# Patient Record
Sex: Female | Born: 1990 | Race: Black or African American | Hispanic: No | Marital: Single | Smoking: Current every day smoker
Health system: Southern US, Community
[De-identification: ages and names within clinical notes are randomized; demographics above are authoritative.]

## PROBLEM LIST (undated history)

## (undated) DIAGNOSIS — F419 Anxiety disorder, unspecified: Secondary | ICD-10-CM

## (undated) DIAGNOSIS — G8929 Other chronic pain: Secondary | ICD-10-CM

---
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
## 2008-04-06 ENCOUNTER — Emergency Department (HOSPITAL_COMMUNITY): Admission: EM | Admit: 2008-04-06 | Discharge: 2008-04-06 | Payer: Self-pay | Admitting: Family Medicine

## 2008-11-01 ENCOUNTER — Encounter
Admission: RE | Admit: 2008-11-01 | Discharge: 2008-11-01 | Payer: Self-pay | Admitting: Physical Medicine and Rehabilitation
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
## 2011-04-01 LAB — COMPREHENSIVE METABOLIC PANEL
AST: 19
Alkaline Phosphatase: 78
BUN: 11
CO2: 27
Chloride: 105
GFR calc Af Amer: >60
Sodium: 140
Total Bilirubin: 0.7
Total Protein: 6.9

## 2011-04-01 LAB — I-STAT 8, (EC8 V) (CONVERTED LAB)
HCT: 39
Hemoglobin: 13.3
Operator id: 270111
Sodium: 141

## 2011-04-01 LAB — DIFFERENTIAL
Lymphocytes Relative: 41
Lymphs Abs: 2.9
Neutrophils Relative %: 50

## 2011-04-01 LAB — POCT I-STAT CREATININE: Operator id: 270111

## 2011-04-01 LAB — CBC
MCHC: 33.2
Platelets: 344
RBC: 4.38

## 2011-04-04 LAB — URINALYSIS, ROUTINE W REFLEX MICROSCOPIC
Glucose, UA: NEGATIVE
Hgb urine dipstick: NEGATIVE
Ketones, ur: 40 — AB
Leukocytes, UA: NEGATIVE
Specific Gravity, Urine: 1.03

## 2011-04-04 LAB — PREGNANCY, URINE: Preg Test, Ur: NEGATIVE

## 2015-08-27 ENCOUNTER — Emergency Department (HOSPITAL_COMMUNITY): Payer: Medicaid Other

## 2015-08-27 ENCOUNTER — Emergency Department (HOSPITAL_COMMUNITY)
Admission: EM | Admit: 2015-08-27 | Discharge: 2015-08-27 | Disposition: A | Discharge status: Home / Self Care | Payer: Medicaid Other | Attending: Emergency Medicine | Admitting: Emergency Medicine

## 2015-08-27 ENCOUNTER — Encounter (HOSPITAL_COMMUNITY): Payer: Self-pay | Admitting: *Deleted

## 2015-08-27 DIAGNOSIS — Y998 Other external cause status: Secondary | ICD-10-CM | POA: Diagnosis not present

## 2015-08-27 DIAGNOSIS — G8929 Other chronic pain: Secondary | ICD-10-CM | POA: Diagnosis not present

## 2015-08-27 DIAGNOSIS — Z880 Allergy status to penicillin: Secondary | ICD-10-CM | POA: Diagnosis not present

## 2015-08-27 DIAGNOSIS — F17210 Nicotine dependence, cigarettes, uncomplicated: Secondary | ICD-10-CM | POA: Diagnosis not present

## 2015-08-27 DIAGNOSIS — Y9389 Activity, other specified: Secondary | ICD-10-CM | POA: Insufficient documentation

## 2015-08-27 DIAGNOSIS — S161XXA Strain of muscle, fascia and tendon at neck level, initial encounter: Secondary | ICD-10-CM | POA: Diagnosis not present

## 2015-08-27 DIAGNOSIS — S39012A Strain of muscle, fascia and tendon of lower back, initial encounter: Secondary | ICD-10-CM | POA: Diagnosis not present

## 2015-08-27 DIAGNOSIS — Z8659 Personal history of other mental and behavioral disorders: Secondary | ICD-10-CM | POA: Diagnosis not present

## 2015-08-27 DIAGNOSIS — Y92410 Unspecified street and highway as the place of occurrence of the external cause: Secondary | ICD-10-CM | POA: Insufficient documentation

## 2015-08-27 DIAGNOSIS — S199XXA Unspecified injury of neck, initial encounter: Secondary | ICD-10-CM | POA: Diagnosis present

## 2015-08-27 HISTORY — DX: Anxiety disorder, unspecified: F41.9

## 2015-08-27 HISTORY — DX: Other chronic pain: G89.29

## 2015-08-27 LAB — POC URINE PREG, ED: Preg Test, Ur: NEGATIVE

## 2015-08-27 MED ORDER — IBUPROFEN 800 MG PO TABS: 800.0000 mg | ORAL_TABLET | Freq: Three times a day (TID) | ORAL | Status: AC | PRN

## 2015-08-27 MED ORDER — TRAMADOL HCL 50 MG PO TABS: 50.0000 mg | ORAL_TABLET | Freq: Four times a day (QID) | ORAL | Status: AC | PRN

## 2015-08-27 NOTE — ED Notes (Signed)
Declined W/C at D/C and was escorted to lobby by RN. 

## 2015-08-27 NOTE — Discharge Instructions (Signed)
Return here as needed.  Your x-rays did not show any abnormality.  Use ice and heat on your neck and back
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
